# Patient Record
Sex: Male | Born: 1970 | Race: White | Hispanic: No | Marital: Married | Smoking: Never smoker
Health system: Southern US, Community
[De-identification: ages and names within clinical notes are randomized; demographics above are authoritative.]

---

## 2010-10-18 ENCOUNTER — Observation Stay (HOSPITAL_COMMUNITY)
Admission: AD | Admit: 2010-10-18 | Discharge: 2010-10-19 | Disposition: A | Discharge status: Home / Self Care | Payer: BC Managed Care – PPO | Source: Other Acute Inpatient Hospital | Attending: Internal Medicine | Admitting: Internal Medicine

## 2010-10-18 ENCOUNTER — Emergency Department (INDEPENDENT_AMBULATORY_CARE_PROVIDER_SITE_OTHER): Payer: BC Managed Care – PPO

## 2010-10-18 ENCOUNTER — Emergency Department (HOSPITAL_BASED_OUTPATIENT_CLINIC_OR_DEPARTMENT_OTHER)
Admission: EM | Admit: 2010-10-18 | Discharge: 2010-10-18 | Disposition: A | Discharge status: Nursing Home (Includes ICF) | Payer: BC Managed Care – PPO | Source: Home / Self Care | Attending: Emergency Medicine | Admitting: Emergency Medicine

## 2010-10-18 DIAGNOSIS — F3289 Other specified depressive episodes: Secondary | ICD-10-CM | POA: Insufficient documentation

## 2010-10-18 DIAGNOSIS — R0602 Shortness of breath: Secondary | ICD-10-CM | POA: Insufficient documentation

## 2010-10-18 DIAGNOSIS — R079 Chest pain, unspecified: Principal | ICD-10-CM | POA: Insufficient documentation

## 2010-10-18 DIAGNOSIS — F329 Major depressive disorder, single episode, unspecified: Secondary | ICD-10-CM | POA: Insufficient documentation

## 2010-10-18 LAB — DIFFERENTIAL
Eosinophils Absolute: 0.3 10*3/uL (ref 0.0–0.7)
Lymphocytes Relative: 43 % (ref 12–46)
Lymphs Abs: 3.3 10*3/uL (ref 0.7–4.0)
Monocytes Relative: 9 % (ref 3–12)
Neutrophils Relative %: 43 % (ref 43–77)

## 2010-10-18 LAB — CBC
HCT: 42.2 % (ref 39.0–52.0)
Hemoglobin: 14.9 g/dL (ref 13.0–17.0)
MCH: 29.1 pg (ref 26.0–34.0)
MCV: 82.4 fL (ref 78.0–100.0)
Platelets: 261 10*3/uL (ref 150–400)
RBC: 5.12 MIL/uL (ref 4.22–5.81)

## 2010-10-18 LAB — POCT CARDIAC MARKERS
CKMB, poc: <1 ng/mL — ABNORMAL LOW (ref 1.0–8.0)
Troponin i, poc: <0.05 ng/mL (ref 0.00–0.09)
Troponin i, poc: <0.05 ng/mL (ref 0.00–0.09)

## 2010-10-18 LAB — BASIC METABOLIC PANEL
BUN: 14 mg/dL (ref 6–23)
CO2: 27 mEq/L (ref 19–32)
Chloride: 103 mEq/L (ref 96–112)
Creatinine, Ser: 1 mg/dL (ref 0.4–1.5)
Glucose, Bld: 100 mg/dL — ABNORMAL HIGH (ref 70–99)

## 2010-10-18 LAB — D-DIMER, QUANTITATIVE: D-Dimer, Quant: <0.22 ug/mL-FEU (ref 0.00–0.48)

## 2010-10-19 ENCOUNTER — Inpatient Hospital Stay (HOSPITAL_COMMUNITY): Payer: BC Managed Care – PPO

## 2010-10-19 DIAGNOSIS — R072 Precordial pain: Secondary | ICD-10-CM

## 2010-10-19 LAB — CARDIAC PANEL(CRET KIN+CKTOT+MB+TROPI)
CK, MB: 0.4 ng/mL (ref 0.3–4.0)
CK, MB: 0.4 ng/mL (ref 0.3–4.0)
Relative Index: INVALID (ref 0.0–2.5)
Total CK: 58 U/L (ref 7–232)
Total CK: 58 U/L (ref 7–232)
Troponin I: 0.02 ng/mL (ref 0.00–0.06)

## 2010-10-19 LAB — LIPID PANEL
LDL Cholesterol: 111 mg/dL — ABNORMAL HIGH (ref 0–99)
Triglycerides: 196 mg/dL — ABNORMAL HIGH (ref <150)

## 2010-10-19 MED ORDER — TECHNETIUM TC 99M TETROFOSMIN IV KIT
30.0000 | PACK | Freq: Once | INTRAVENOUS | Status: AC | PRN
  Administered 2010-10-19: 30 via INTRAVENOUS

## 2010-10-19 MED ORDER — TECHNETIUM TC 99M TETROFOSMIN IV KIT
10.0000 | PACK | Freq: Once | INTRAVENOUS | Status: AC | PRN
  Administered 2010-10-19: 10 via INTRAVENOUS

## 2010-10-20 ENCOUNTER — Other Ambulatory Visit (HOSPITAL_COMMUNITY): Payer: BC Managed Care – PPO

## 2010-10-26 NOTE — Discharge Summary (Signed)
  NAME:  Shane Archer, Shane Archer NO.:  [id number]  MEDICAL RECORD NO.:  [id number]           PATIENT TYPE:  I  LOCATION:  2012                         FACILITY:  [facility]  PHYSICIAN:  Pincus Large, MD     DATE OF BIRTH:  12-14-1970  DATE OF ADMISSION:  10/18/2010 DATE OF DISCHARGE:  10/19/2010                              DISCHARGE SUMMARY   REASON FOR ADMISSION:  Chest tightness.  DISCHARGE DIAGNOSES: 1. Atypical chest pain. 2. Depression.  DISCHARGE MEDICATION: 1. Aspirin 81 mg daily. 2. Zoloft 100 mg daily.  CONSULT THAT WAS DONE:  Cardiology consult with [facility], Dr. Aggie Hacker.  IMAGES THAT WAS DONE: 1. Stress Myoview, which was normal as per Cardiology. 2. Chest x-ray with no active lung disease.  HOSPITAL COURSE:  He is a 40 year old male who has been in good health with essentially negative cardiovascular risk factors.  He did regular exercises since October 2011 and swimming.  He was trying to train for 2000 yard swim with his daughter about 2 weeks ago, he began having onset of dyspnea on exertion, decreased exercise tolerance, and had an episode of exercise approximately 4 days ago where he actually vomited because of the exertional level.  He had onset of chest tightness and chest pressure with shortness of breath, which he described as 3/10, which comes down to 1/10 with morphine.  While in the emergency room, his WBC was 7600, hematocrit was 42.2, platelet was 2600.  Cardiac enzymes x3 were negative.  Chest x-ray was reported was unremarkable. EKG was normal sinus with no significant ST-T changes.  The patient was admitted to rule out acute coronary syndrome.  He was seen by Cardiology.  Cardiac enzymes x3 were negative.  The patient went for stress Myoview today, and I got prelim from Cardiology that test was negative and he is good to go home.  The patient as per Cardiology may need to have at least a month of light activity and  to be on aspirin a day during that time frame.  PHYSICAL EXAMINATION:  VITAL SIGNS:  Temperature is 98.1, pulse is 61, respirations is 16, and blood pressure is 100/69, satting 92% on room air. GENERAL:  He is awake and alert at the time seen, no acute distress. HEENT:  Normocephalic and atraumatic.  Conjunctivae pink.  Pupils equal, reactive to light bilaterally. LUNGS:  No wheezes or crackles. ABDOMEN:  Soft.  Bowel sounds present.  S1 and S2.  No murmurs, gallops, or rubs. EXTREMITIES:  No edema.  DISPOSITION:  Home.  Diet, regular diet.  Follow up with primary care in 1 week.          ______________________________ Pincus Large, MD     SA/MEDQ  D:  10/19/2010  T:  10/20/2010  Job:  [id number]  Electronically Signed by Pincus Large MD on 10/26/2010 03:52:21 PM

## 2010-10-27 NOTE — Consult Note (Signed)
NAME:  Shane Archer, Shane Archer NO.:  [id number]  MEDICAL RECORD NO.:  [id number]           PATIENT TYPE:  I  LOCATION:  2012                         FACILITY:  [facility]  PHYSICIAN:  Colleen Can. Deborah Chalk, M.D.DATE OF BIRTH:  21-Feb-1971  DATE OF CONSULTATION:  10/18/2010 DATE OF DISCHARGE:                                CONSULTATION   We were asked by the [facility] to evaluate Shane Archer for chest pain.  In general, he was in good health with essentially negative cardiovascular risk factors.  He did regular exercise since October 2011 with running and swimming.  He was trying to train for of 2000-yard swim with his daughter.  About 2 weeks ago, he began blue camp at the [facility]. He had onset of dyspnea with exertion with decreased exercise tolerance. He had an episode of exercise approximately 4 days ago where he actually vomited because of the exertional level.  He has had the onset of a chest tightness and chest pressure with shortness of breath that began yesterday and it has been intermittent.  He has had a persistent 3/10 chest tightness yesterday and today it is left at 1/10 but has been persistent.  Because of the symptoms, [facility] for evaluation.  While in the emergency room, he had a white count of 7600, hematocrit of 42.2, and platelet count 261,000.  Chemistries were negative.  Point-of- care markers were negative x2.  Chest x-ray was reported as unremarkable.  He had the pressure in his chest being somewhat better after giving nitroglycerin.  He was given morphine and had resolution of the discomfort but is admitted now for evaluation.  PAST MEDICAL HISTORY:  Some depression, for which he is on Zoloft 10 mg per day.  He has had no hospitalizations and no previous surgeries.  He has no allergies.  FAMILY HISTORY:  Negative for cardiovascular disease.  His father died of melanoma.  SOCIAL HISTORY:  He is a Theatre stage manager.  He is nonsmoker.  He is married.  He has social alcohol.  REVIEW OF SYSTEMS:  Negative complete exam except for the history of present illness.  PHYSICAL EXAMINATION:  GENERAL:  He is a pleasant 40 year old male, in no acute distress. VITAL SIGNS:  Blood pressure is 120/57, heart rate 60. SKIN:  Warm and dry color is normal. HEENT:  Negative.  Pupils equal, round, and reactive to light. Oropharynx unremarkable. NECK:  Supple without bruits. LUNGS:  Clear to auscultation. HEART:  Regular rate and rhythm without murmur. ABDOMEN:  Soft, nontender. EXTREMITIES:  Without edema. NEUROLOGIC:  He is intact.  EKG shows an rSR-prime in V1.  It is somewhat nondeterminate axis.  D-dimer was negative.  OVERALL IMPRESSION: 1. Atypical chest pain. 2. Episode of over exertion, possibly contributing to the chest pain.  PLAN:  We will rule out myocardial infarction.  We will get a 2-D echocardiogram.  We will arrange for him to have a stress Myoview.  DISCUSSION:  It could be that he has ruptured a coronary plaque and does have underlying coronary disease.  Hopefully, we can find this with  our studies if it is the etiology of his symptoms.  No matter what, if the tests are negative I think he needs to have at least a month of light activity and be on an aspirin a day during that time frame.  It could be that with this episode of vomiting, and he had mild aspiration and has some dyspnea and chest tightness related to tracheal or bronchial symptoms.  In any case, it is clear that his symptoms are in large part related to this episode of overexertion in the round of athletic training.     Colleen Can. Deborah Chalk, M.D.     SNT/MEDQ  D:  10/18/2010  T:  10/19/2010  Job:  [id number]  Electronically Signed by Roger Shelter M.D. on 10/27/2010 04:17:00 PM

## 2010-11-08 LAB — PULMONARY FUNCTION TEST

## 2010-12-08 ENCOUNTER — Ambulatory Visit (INDEPENDENT_AMBULATORY_CARE_PROVIDER_SITE_OTHER): Payer: BC Managed Care – PPO | Admitting: Internal Medicine

## 2010-12-08 ENCOUNTER — Encounter: Payer: Self-pay | Admitting: Internal Medicine

## 2010-12-08 VITALS — BP 110/78 | HR 56 | Temp 97.7°F | Ht 73.0 in | Wt 193.0 lb

## 2010-12-08 DIAGNOSIS — R0600 Dyspnea, unspecified: Secondary | ICD-10-CM | POA: Insufficient documentation

## 2010-12-08 DIAGNOSIS — R0989 Other specified symptoms and signs involving the circulatory and respiratory systems: Secondary | ICD-10-CM

## 2010-12-08 NOTE — Assessment & Plan Note (Signed)
Symptoms are markedly disproportionate to objective findings and not clear this is a lung problem but pt does appear to have difficult airway management issues.   DDX of  difficult airways managment all start with A and  include Adherence, Ace Inhibitors, Acid Reflux, Active Sinus Disease, Alpha 1 Antitripsin deficiency, Anxiety masquerading as Airways dz,  ABPA,  allergy(esp in young), Aspiration (esp in elderly), Adverse effects of DPI,  Active smokers, plus two Bs  = Bronchiectasis and Beta blocker use..and one C= CHF   Acid reflux is the most likely explanation for both the chest tightness and the variable sob not reproduced by exertion. See instructions for specific recommendations which were reviewed directly with the patient who was given a copy with highlighter outlining the key components.

## 2010-12-08 NOTE — Patient Instructions (Signed)
GERD (REFLUX)  is an extremely common cause of respiratory symptoms, many times with no significant heartburn at all.    It can be treated with medication, but also with lifestyle changes including avoidance of late meals, excessive alcohol, smoking cessation, and avoid fatty foods, chocolate, peppermint, colas, red wine, and acidic juices such as orange juice.  NO MINT OR MENTHOL PRODUCTS SO NO COUGH DROPS  USE SUGARLESS CANDY INSTEAD (jolley ranchers or Stover's)  NO OIL BASED VITAMINS   Omeprazole 40 mg Take 30-60 min before first meal of the day and this should improve overall several weeks.   If you are satisfied with your treatment plan let your doctor know and he/she can either refill your medications or you can return here when your prescription runs out.     If in any way you are not 100% satisfied, including your exercise tolerance,  please tell us.  If 100% better, tell your friends!

## 2010-12-08 NOTE — Progress Notes (Signed)
  Subjective:    Patient ID: Shane Archer, male    DOB: 08-Mar-1971, 40 y.o.   MRN: [id number]  HPI   63 yowm never smoker never resp problems until Spring 2012   12/08/2010 Initial pulmonary office cc  Generalized bilateral chest pressure acute onset variable pattern  10/18/10 saw Dr Luz Brazen abn ekg > ER eval with a nl cxr admitted [facility] overnight with neg stress test and no further f/u planned.   Chest pressure is variable with no pattern x not Sleeping ok without nocturnal  or early am exac of resp c/o's   Ex tolerance is not as good as it was but pt reports  symptoms are just as likely at rest as they are with exertion.  No cough or overt hb but no better on omeprazole just started taking it for last 3 days Prior to day of OV    Pt denies any significant sore throat, dysphagia, itching, sneezing,  nasal congestion or excess/ purulent secretions,  fever, chills, sweats, unintended wt loss, pleuritic or exertional cp, hempoptysis, orthopnea pnd or leg swelling.    Also denies any obvious fluctuation of symptoms with weather or environmental changes or other aggravating or alleviating factors.     Review of Systems  Constitutional: Negative for fever, chills, activity change, appetite change and unexpected weight change.  HENT: Negative for congestion, sore throat, rhinorrhea, sneezing, trouble swallowing, dental problem, voice change and postnasal drip.   Eyes: Negative for visual disturbance.  Respiratory: Positive for shortness of breath. Negative for cough and choking.   Cardiovascular: Negative for chest pain and leg swelling.  Gastrointestinal: Negative for nausea, vomiting and abdominal pain.  Genitourinary: Negative for difficulty urinating.  Musculoskeletal: Negative for arthralgias.  Skin: Negative for rash.  Psychiatric/Behavioral: Negative for behavioral problems and confusion.       Objective:   Physical Exam    pleasant amb wf nad  Wt 193 12/08/2010  Classic pseudowheeze  resolves with purse lip maneuver HEENT: nl dentition, turbinates, and orophanx. Nl external ear canals without cough reflex   NECK :  without JVD/Nodes/TM/ nl carotid upstrokes bilaterally   LUNGS: no acc muscle use, clear to A and P bilaterally without cough on insp or exp maneuvers   CV:  RRR  no s3 or murmur or increase in P2, no edema   ABD:  soft and nontender with nl excursion in the supine position. No bruits or organomegaly, bowel sounds nl  MS:  warm without deformities, calf tenderness, cyanosis or clubbing  SKIN: warm and dry without lesions    NEURO:  alert, approp, no deficits     cxr 10/18/10 wnl Assessment & Plan:

## 2010-12-28 ENCOUNTER — Encounter: Payer: Self-pay | Admitting: Internal Medicine

## 2014-12-24 ENCOUNTER — Emergency Department (HOSPITAL_COMMUNITY)
Admission: EM | Admit: 2014-12-24 | Discharge: 2014-12-24 | Disposition: A | Discharge status: Home / Self Care | Payer: Managed Care, Other (non HMO) | Attending: Emergency Medicine | Admitting: Emergency Medicine

## 2014-12-24 ENCOUNTER — Encounter (HOSPITAL_COMMUNITY): Payer: Self-pay | Admitting: Emergency Medicine

## 2014-12-24 DIAGNOSIS — Y998 Other external cause status: Secondary | ICD-10-CM | POA: Diagnosis not present

## 2014-12-24 DIAGNOSIS — T782XXA Anaphylactic shock, unspecified, initial encounter: Secondary | ICD-10-CM | POA: Diagnosis not present

## 2014-12-24 DIAGNOSIS — R55 Syncope and collapse: Secondary | ICD-10-CM | POA: Insufficient documentation

## 2014-12-24 DIAGNOSIS — S80862A Insect bite (nonvenomous), left lower leg, initial encounter: Secondary | ICD-10-CM | POA: Diagnosis present

## 2014-12-24 DIAGNOSIS — Y9289 Other specified places as the place of occurrence of the external cause: Secondary | ICD-10-CM | POA: Insufficient documentation

## 2014-12-24 DIAGNOSIS — R42 Dizziness and giddiness: Secondary | ICD-10-CM | POA: Diagnosis not present

## 2014-12-24 DIAGNOSIS — Y9389 Activity, other specified: Secondary | ICD-10-CM | POA: Insufficient documentation

## 2014-12-24 DIAGNOSIS — W57XXXA Bitten or stung by nonvenomous insect and other nonvenomous arthropods, initial encounter: Secondary | ICD-10-CM | POA: Insufficient documentation

## 2014-12-24 DIAGNOSIS — R112 Nausea with vomiting, unspecified: Secondary | ICD-10-CM | POA: Diagnosis not present

## 2014-12-24 DIAGNOSIS — Z79899 Other long term (current) drug therapy: Secondary | ICD-10-CM | POA: Insufficient documentation

## 2014-12-24 MED ORDER — EPINEPHRINE 0.3 MG/0.3ML IJ SOAJ: 0.3000 mg | Freq: Once | INTRAMUSCULAR | Status: AC

## 2014-12-24 NOTE — ED Provider Notes (Signed)
CSN: [id number]     Arrival date & time 12/24/14  2018 History  This chart was scribed for Shane Archer [name], MD by Annye AsaAnna Dorsett, ED Scribe. This patient was seen in room D31C/D31C and the patient's care was started at 11:10 PM.    Chief Complaint  Patient presents with  . Insect Bite   Patient is a 44 y.o. male presenting with animal bite and allergic reaction. The history is provided by the patient. No language interpreter was used.  Animal Bite Contact animal:  Insect Location:  Leg Leg injury location:  L lower leg Time since incident:  4 hours Pain details:    Quality:  Stinging   Severity:  Moderate   Timing:  Constant   Progression:  Resolved Incident location:  Outside Provoked: unprovoked   Notifications:  None Associated symptoms: no rash and no swelling   Allergic Reaction Presenting symptoms: no difficulty breathing, no rash and no swelling   Severity:  Moderate Prior episodes: One prior episode with similar symptoms. Context: insect bite/sting   Relieved by:  Antihistamines (Fluid bolus) Worsened by:  Nothing tried    HPI Comments: Shane Archer is an otherwise healthy 44 y.o. male brought in by ambulance who presents to the Emergency Department complaining of allergic reaction to a bee sting. Patient explains he was mowing his lawn this afternoon around 19:00 when he felt a stinging sensation to his left ankle; he looked down to see a yellow, bee-like insect. Within ten minutes, he became nauseous, diaphoretic, and lightheaded, with 1 episode of vomiting and brief syncope. He also describes the sensation of urgently needing to have a bowel movement. Wife describes patient as "out of it" when she arrived home just after the incident. Per EMS, patient's blood pressure was in the 80s systolic on arrival; he was given 50mg  Benadryl, 50mg  Zantac, and 500cc bolus of NS en route. He denies tongue, lip, or facial swelling. He denies rash, diarrhea, chest pain, SOB. He states he  was stung by a bee at age 44 with a similar reaction, but has been stung again since that time with no symptoms. Patient states he feels well at this time.    PMH - none  Family History  Problem Relation Age of Onset  . Melanoma Father    History  Substance Use Topics  . Smoking status: Never Smoker   . Smokeless tobacco: Never Used  . Alcohol Use: 3.0 oz/week    6 drink(s) per week    Review of Systems  Constitutional: Positive for diaphoresis.  HENT: Negative for facial swelling.   Respiratory: Negative for shortness of breath.   Cardiovascular: Negative for chest pain.  Gastrointestinal: Positive for nausea and vomiting. Negative for diarrhea.  Skin: Negative for rash.  Neurological: Positive for syncope and light-headedness.  All other systems reviewed and are negative.   Allergies  Review of patient's allergies indicates no known allergies.  Home Medications   Prior to Admission medications   Medication Sig Start Date End Date Taking? Authorizing Provider  Multiple Vitamin (MULTIVITAMIN) capsule Take 1 capsule by mouth daily.      Historical Provider, MD  omeprazole (PRILOSEC) 40 MG capsule Take 1 tablet by mouth daily. 10/29/10   Historical Provider, MD  sertraline (ZOLOFT) 50 MG tablet Take 1 tablet by mouth daily. 11/28/10   Historical Provider, MD   BP 102/61 mmHg  Pulse 65  Temp(Src) 97.9 F (36.6 C) (Oral)  Resp 16  Ht 6\' 1"  (1.854 m)  Wt 192 lb (87.091 kg)  BMI 25.34 kg/m2  SpO2 95% Physical Exam  Nursing note and vitals reviewed.    CONSTITUTIONAL: Well developed/well nourished HEAD: Normocephalic/atraumatic EYES: EOMI/PERRL ENMT: Mucous membranes moist; no angioedema NECK: supple no meningeal signs SPINE/BACK:entire spine nontender CV: S1/S2 noted, no murmurs/rubs/gallops noted LUNGS: Lungs are clear to auscultation bilaterally, no apparent distress ABDOMEN: soft, nontender, no rebound or guarding, bowel sounds noted throughout abdomen GU:no  cva tenderness NEURO: Pt is awake/alert/appropriate, moves all extremitiesx4.  No facial droop.   EXTREMITIES: pulses normal/equal, full ROM; tiny lesion to left lower leg; no rash SKIN: warm, color normal, no rash noted PSYCH: no abnormalities of mood noted, alert and oriented to situation   ED Course  Procedures   DIAGNOSTIC STUDIES: Oxygen Saturation is 97% on RA, adequate by my interpretation.    COORDINATION OF CARE: 11:19 PM Discussed treatment plan with pt at bedside and pt agreed to plan.  Pt back to baseline He is ambulatory No hypotension It has been 4 hrs since bee sting Suspect anaphylaxis that has resolved He is well for d/c home Will give epipen at discharge Discussed appropriate use of epipen    MDM   Final diagnoses:  Anaphylaxis, initial encounter    Nursing notes including past medical history and social history reviewed and considered in documentation   I personally performed the services described in this documentation, which was scribed in my presence. The recorded information has been reviewed and is accurate.      Shane Archer Juddson Cobern, MD 12/24/14 [phone]

## 2014-12-24 NOTE — Discharge Instructions (Signed)
Anaphylactic Reaction °An anaphylactic reaction is a sudden, severe allergic reaction. It affects the whole body. It can be life threatening. You may need to stay in the hospital.  °HOME CARE °· Wear a medical bracelet or necklace that lists your allergy. °· Carry your allergy kit or medicine shot to treat severe allergic reactions with you. These can save your life. °· Do not drive until medicine from your shot has worn off, unless your doctor says it is okay. °· If you have hives or a rash: °¨ Take medicine as told by your doctor. °¨ You may take over-the-counter antihistamine medicine. °¨ Place cold cloths on your skin. Take baths in cool water. Avoid hot baths and hot showers. °GET HELP RIGHT AWAY IF:  °· Your mouth is puffy (swollen), or you have trouble breathing. °· You start making whistling sounds when you breathe (wheezing). °· You have a tight feeling in your chest or throat. °· You have a rash, hives, puffiness, or itching on your body. °· You throw up (vomit) or have watery poop (diarrhea). °· You feel dizzy or pass out (faint). °· You think you are having an allergic reaction. °· You have new symptoms. °This is an emergency. Use your medicine shot or allergy kit as told. Call your local emergency services (911 in [place] Even if you feel better after the shot, you need to go to the hospital emergency department. °MAKE SURE YOU:  °· Understand these instructions. °· Will watch your condition. °· Will get help right away if you are not doing well or get worse. °Document Released: 01/11/2008 Document Revised: 01/24/2012 Document Reviewed: 10/26/2011 °ExitCare® Patient Information ©2015 [employer]. This information is not intended to replace advice given to you by your health care provider. Make sure you discuss any questions you have with your health care provider. ° °

## 2014-12-24 NOTE — ED Notes (Signed)
Pt was mowing grass and felt a sharp pain on his left ankle and seen a yellow bee. Pt states he started sweaty a lot and became very nauseous. Pt vomited x3 and laid down on the grass until EMS arrival. Per ems pts bp was 80s systolic. 50 mg benadryl 50mg  zantac and 500 cc Bolus of NS.  Pt is alert and ox3 on arrival. Warm and dry. No n/v/d at this time.

## 2016-04-02 ENCOUNTER — Encounter (HOSPITAL_COMMUNITY): Payer: Self-pay | Admitting: Emergency Medicine

## 2016-04-02 ENCOUNTER — Emergency Department (HOSPITAL_COMMUNITY)
Admission: EM | Admit: 2016-04-02 | Discharge: 2016-04-03 | Disposition: A | Discharge status: Home / Self Care | Payer: 59 | Attending: Emergency Medicine | Admitting: Emergency Medicine

## 2016-04-02 DIAGNOSIS — T63441A Toxic effect of venom of bees, accidental (unintentional), initial encounter: Secondary | ICD-10-CM | POA: Insufficient documentation

## 2016-04-02 DIAGNOSIS — Y929 Unspecified place or not applicable: Secondary | ICD-10-CM | POA: Diagnosis not present

## 2016-04-02 DIAGNOSIS — Y939 Activity, unspecified: Secondary | ICD-10-CM | POA: Diagnosis not present

## 2016-04-02 DIAGNOSIS — Z79899 Other long term (current) drug therapy: Secondary | ICD-10-CM | POA: Insufficient documentation

## 2016-04-02 DIAGNOSIS — T7840XA Allergy, unspecified, initial encounter: Secondary | ICD-10-CM | POA: Diagnosis present

## 2016-04-02 DIAGNOSIS — Y999 Unspecified external cause status: Secondary | ICD-10-CM | POA: Insufficient documentation

## 2016-04-02 NOTE — ED Triage Notes (Signed)
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

## 2016-04-02 NOTE — ED Provider Notes (Signed)
[facility]ENCY DEPT Provider Note   CSN: [id number] Arrival date & time: 04/02/16  2229     History   Chief Complaint Chief Complaint  Patient presents with  . Allergic Reaction    HPI Shane Archer is a 45 y.o. male.  Patient arrives via EMS for possible allergic reaction after being stung on the left LE around 8:30 pm. He had a syncopal episode, which has been his reaction in the past. No rash, SOB, lip/tongue/throat swelling. He had an epi-pen and used it but does not feel the pen dispensed the medication. He was given Epi in route by EMS. He continues to feel improved after arrival in the ED without symptoms.   The history is provided by the patient. No language interpreter was used.  Allergic Reaction  Presenting symptoms: no difficulty swallowing, no rash and no swelling   Prior allergic episodes:  Insect allergies Context: insect bite/sting     History reviewed. No pertinent past medical history.  Patient Active Problem List   Diagnosis Date Noted  . Dyspnea 12/08/2010    History reviewed. No pertinent surgical history.     Home Medications    Prior to Admission medications   Medication Sig Start Date End Date Taking? Authorizing Provider  EPINEPHrine (EPIPEN 2-PAK) 0.3 mg/0.3 mL IJ SOAJ injection Inject 0.3 mLs (0.3 mg total) into the muscle once. 12/24/14   Zadie Rhine, MD  Multiple Vitamin (MULTIVITAMIN) capsule Take 1 capsule by mouth daily.      Historical Provider, MD  omeprazole (PRILOSEC) 40 MG capsule Take 1 tablet by mouth daily. 10/29/10   Historical Provider, MD  sertraline (ZOLOFT) 50 MG tablet Take 1 tablet by mouth daily. 11/28/10   Historical Provider, MD    Family History Family History  Problem Relation Age of Onset  . Melanoma Father     Social History Social History  Substance Use Topics  . Smoking status: Never Smoker  . Smokeless tobacco: Never Used  . Alcohol use 3.0 oz/week    6 drink(s) per week     Allergies     Other   Review of Systems Review of Systems  Constitutional: Negative for chills and fever.  HENT: Negative.  Negative for facial swelling, sore throat and trouble swallowing.   Respiratory: Negative.   Cardiovascular: Negative.   Gastrointestinal: Negative.   Musculoskeletal: Negative.   Skin: Negative.  Negative for rash.  Neurological: Positive for syncope and weakness. Negative for headaches.     Physical Exam Updated Vital Signs BP 118/83 (BP Location: Right Arm)   Pulse 88   Temp 98 F (36.7 C) (Oral)   Resp 24   SpO2 98%   Physical Exam  Constitutional: He appears well-developed and well-nourished.  HENT:  Head: Normocephalic.  Mouth/Throat: Oropharynx is clear and moist.  Neck: Normal range of motion. Neck supple.  Cardiovascular: Normal rate and regular rhythm.   No murmur heard. Pulmonary/Chest: Effort normal and breath sounds normal. He has no wheezes. He has no rales.  Abdominal: Soft. Bowel sounds are normal. There is no tenderness. There is no rebound and no guarding.  Musculoskeletal: Normal range of motion.  Neurological: He is alert. No cranial nerve deficit.  Skin: Skin is warm and dry. No rash noted.  Psychiatric: He has a normal mood and affect.     ED Treatments / Results  Labs (all labs ordered are listed, but only abnormal results are displayed) Labs Reviewed - No data to display  EKG  EKG  Interpretation None       Radiology No results found.  Procedures Procedures (including critical care time)  Medications Ordered in ED Medications - No data to display   Initial Impression / Assessment and Plan / ED Course  I have reviewed the triage vital signs and the nursing notes.  Pertinent labs & imaging results that were available during my care of the patient were reviewed by me and considered in my medical decision making (see chart for details).  Clinical Course    Patient arrives after insect sting and syncopal episode  consistent with previous reactions in the past. Given EPI in route and has been asymptomatic in ED. He is observe for 3 hours and remains at baseline. He is felt stable for discharge home.   Final Clinical Impressions(s) / ED Diagnoses   Final diagnoses:  None  1. Insect sting 2. Allergic reaction  New Prescriptions New Prescriptions   No medications on file     Elpidio AnisShari Amadi Yoshino, PA-C 04/03/16 0111    Blane OharaJoshua Zavitz, MD 04/05/16 [phone]

## 2016-04-02 NOTE — ED Notes (Signed)
Sherri, PA at bedside. 

## 2016-04-03 MED ORDER — EPINEPHRINE 0.3 MG/0.3ML IJ SOAJ: 0.3000 mg | Freq: Once | INTRAMUSCULAR | 1 refills | Status: AC

## 2019-09-27 ENCOUNTER — Ambulatory Visit: Payer: Self-pay | Attending: Internal Medicine

## 2019-09-27 DIAGNOSIS — Z23 Encounter for immunization: Secondary | ICD-10-CM | POA: Insufficient documentation

## 2019-09-27 NOTE — Progress Notes (Signed)
   Covid-19 Vaccination Clinic  Name:  Shane Archer    MRN: [id number] DOB: 1971-08-02  09/27/2019  Mr. Treat was observed post Covid-19 immunization for 15 minutes without incidence. He was provided with Vaccine Information Sheet and instruction to access the V-Safe system.   [name] was instructed to call 911 with any severe reactions post vaccine: Marland Kitchen Difficulty breathing  . Swelling of your face and throat  . A fast heartbeat  . A bad rash all over your body  . Dizziness and weakness    Immunizations Administered    Name Date Dose VIS Date Route   Pfizer COVID-19 Vaccine 09/27/2019  9:32 AM 0.3 mL 07/19/2019 Intramuscular   Manufacturer: [employer], [employer]   Lot: [id number]   NDC: [address]-4

## 2019-10-22 ENCOUNTER — Ambulatory Visit: Payer: Self-pay | Attending: Internal Medicine

## 2019-10-22 DIAGNOSIS — Z23 Encounter for immunization: Secondary | ICD-10-CM

## 2019-10-22 NOTE — Progress Notes (Signed)
   [facility] Vaccination [facility]  Name:  Shane Archer    MRN: [id number] DOB: Sep 11, 1970  10/22/2019  [name] was observed post Covid-19 immunization for 30 minutes based on pre-vaccination screening without incident. He was provided with Vaccine Information Sheet and instruction to access the V-Safe system.   [name] was instructed to call 911 with any severe reactions post vaccine: Marland Kitchen Difficulty breathing  . Swelling of face and throat  . A fast heartbeat  . A bad rash all over body  . Dizziness and weakness   Immunizations Administered    Name Date Dose VIS Date Route   Pfizer COVID-19 Vaccine 10/22/2019  9:04 AM 0.3 mL 07/19/2019 Intramuscular   Manufacturer: [employer], [employer]   Lot: [id number]   NDC: [address]-1
